# Patient Record
Sex: Female | Born: 2005 | Race: Black or African American | Hispanic: No | Marital: Single | Smoking: Never smoker
Health system: Southern US, Community
[De-identification: ages and names within clinical notes are randomized; demographics above are authoritative.]

## PROBLEM LIST (undated history)

## (undated) DIAGNOSIS — F419 Anxiety disorder, unspecified: Secondary | ICD-10-CM

## (undated) DIAGNOSIS — R519 Headache, unspecified: Secondary | ICD-10-CM

## (undated) HISTORY — DX: Anxiety disorder, unspecified: F41.9

## (undated) HISTORY — PX: HERNIA REPAIR: SHX51

## (undated) HISTORY — DX: Headache, unspecified: R51.9

## (undated) HISTORY — PX: UMBILICAL HERNIA REPAIR: SUR1181

---
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
## 2024-01-20 ENCOUNTER — Other Ambulatory Visit: Payer: Self-pay | Admitting: Family

## 2024-02-01 ENCOUNTER — Encounter: Payer: Self-pay | Admitting: Family

## 2024-02-14 ENCOUNTER — Encounter: Payer: Self-pay | Admitting: Family

## 2024-02-14 ENCOUNTER — Telehealth: Admitting: Family

## 2024-02-14 DIAGNOSIS — R102 Pelvic and perineal pain unspecified side: Secondary | ICD-10-CM

## 2024-02-14 NOTE — Progress Notes (Signed)
 THIS RECORD MAY CONTAIN CONFIDENTIAL INFORMATION THAT SHOULD NOT BE RELEASED WITHOUT REVIEW OF THE SERVICE PROVIDER.  Virtual Follow-Up Visit via Video Note  I connected with Jessica Mccarty  on 02/14/24 at  3:00 PM EST by a video enabled telemedicine application and verified that I am speaking with the correct person using two identifiers.   Patient/parent location: dorm, [employer] Provider location: remote, [place]    I discussed the limitations of evaluation and management by telemedicine and the availability of in person appointments.  I discussed that the purpose of this telehealth visit is to provide medical care while limiting exposure to the novel coronavirus.  The patient expressed understanding and agreed to proceed.   Jessica Mccarty is a 19 y.o. female referred by Belenda Macario HERO, NP here today for follow-up of pelvic pain.   History was provided by the patient.  Supervising Physician: Dr. Kreg Helena   Plan from Last Visit 12/28/23 1. Pelvic pain (Primary) 2. Friable cervix 3. Irregular intermenstrual bleeding - WET PREP BY MOLECULAR PROBE - Thyroid  Panel With TSH - CBC with Differential/Platelet - Comprehensive metabolic panel with GFR - hCG, quantitative, pregnancy - WET PREP BY MOLECULAR PROBE - Cervicovaginal ancillary only   -concern for friable cervix and +CMT today; will cover for PID -labs to asses hCG, thyroid , assess for anemia, liver and kidney enzymes; wet prep and gc/c from cervix today   - cefTRIAXone  (ROCEPHIN ) injection 500 mg - azithromycin  (ZITHROMAX ) tablet 1,000 mg - metroNIDAZOLE  (FLAGYL ) 500 MG tablet; Take 1 tablet (500 mg total) by mouth 2 (two) times daily for 14 days.  Dispense: 28 tablet; Refill: 0   4. Unprotected sexual intercourse - hCG, quantitative, pregnancy   5. High urine leukocyte count -high leuk count, foul smell urine; send for culture  - Urine Culture   6. Vitamin D  deficiency - VITAMIN D  25 Hydroxy (Vit-D Deficiency,  Fractures)   7. Routine screening for STI (sexually transmitted infection) - Cervicovaginal ancillary only   8. Screening for genitourinary condition - POCT urinalysis dipstick   9. Pregnancy examination or test, negative result - POCT urine pregnancy - hCG, quantitative, pregnancy  Chief Complaint:   History of Present Illness:  -same feeling of pelvic pain, but not as intense  -just got off her cycle, typically only bleeds for 5 days  -pain was before her cycle  -vaginal discharge changes, here and there with vaginal odor with fishy smell then it goes away  -no constipation issues  -some pain with intercourse; concern for reinfection - one of her biggest worries is reinfection; when she tried to take the Flagyl , she immediately threw up  -had really intense stomach pain yesterday; today much better  -appetite has been up and down; half eating or forcing herself to eat  -constipation has been OK lately; no issues lately  -no dysuria  -no rashes or lesions  -did not get a chance to take the Vitamin D  high dose supplementation   No Known Allergies Outpatient Medications Prior to Visit  Medication Sig Dispense Refill   ferrous sulfate  325 (65 FE) MG tablet TAKE ONE TABLET BY MOUTH DAILY WITH BREAKFAST. 90 tablet 1   fluticasone  (FLONASE ) 50 MCG/ACT nasal spray Place 2 sprays into both nostrils daily. Shake well before use. Gently blow nose before spraying. Do not blow nose immediately after use. You should not taste the medication or feel it going down your throat; if you do, adjust your technique. (Patient not taking: Reported on 12/28/2023) 16 g  0   Vitamin D , Ergocalciferol , (DRISDOL ) 1.25 MG (50000 UNIT) CAPS capsule Take 1 capsule (50,000 Units total) by mouth every 7 (seven) days. 8 capsule 0   No facility-administered medications prior to visit.     Patient Active Problem List   Diagnosis Date Noted   Annual physical exam 10/04/2018   BMI (body mass index), pediatric,  5% to less than 85% for age 08/16/2015    The following portions of the patient's history were reviewed and updated as appropriate: allergies, current medications, past family history, past medical history, past social history, past surgical history, and problem list.  Visual Observations/Objective:   General Appearance: Well nourished well developed, in no apparent distress.  Eyes: conjunctiva no swelling or erythema ENT/Mouth: No hoarseness, No cough for duration of visit.  Neck: Supple  Respiratory: Respiratory effort normal, normal rate, no retractions or distress.   Cardio: Appears well-perfused, noncyanotic Musculoskeletal: no obvious deformity Skin: visible skin without rashes, ecchymosis, erythema Neuro: Awake and oriented X 3,  Psych:  normal affect, Insight and Judgment appropriate.    Assessment/Plan: 1. Pelvic pain (Primary) -concern for reinfection  -recommended RTC this week for pelvic exam, as well as additional testing - will screen for CBCd, RPR, HIV and gc/c, wet prep by pelvic exam.     I discussed the assessment and treatment plan with the patient and/or parent/guardian.  They were provided an opportunity to ask questions and all were answered.  They agreed with the plan and demonstrated an understanding of the instructions. They were advised to call back or seek an in-person evaluation in the emergency room if the symptoms worsen or if the condition fails to improve as anticipated.   Follow-up:   in-person this week for pelvic exam and testing     Bari CHRISTELLA Molt, NP    CC: Klett, Macario CHRISTELLA, NP, Klett, Macario CHRISTELLA, NP

## 2024-02-15 ENCOUNTER — Encounter: Admitting: Family

## 2024-02-17 ENCOUNTER — Encounter: Payer: Self-pay | Admitting: *Deleted

## 2024-02-17 ENCOUNTER — Other Ambulatory Visit (HOSPITAL_COMMUNITY): Admission: RE | Admit: 2024-02-17 | Source: Ambulatory Visit

## 2024-02-17 ENCOUNTER — Encounter: Payer: Self-pay | Admitting: Family

## 2024-02-17 ENCOUNTER — Ambulatory Visit: Admitting: Family

## 2024-02-17 VITALS — BP 110/71 | HR 78 | Ht 65.35 in | Wt 146.4 lb

## 2024-02-17 DIAGNOSIS — N946 Dysmenorrhea, unspecified: Secondary | ICD-10-CM

## 2024-02-17 DIAGNOSIS — Z3202 Encounter for pregnancy test, result negative: Secondary | ICD-10-CM

## 2024-02-17 DIAGNOSIS — Z113 Encounter for screening for infections with a predominantly sexual mode of transmission: Secondary | ICD-10-CM

## 2024-02-17 LAB — POCT URINE PREGNANCY: Preg Test, Ur: NEGATIVE

## 2024-02-17 NOTE — Progress Notes (Addendum)
 " History was provided by the patient.  Jessica Mccarty is a 19 y.o. female who is here for dysmenorrhea.   PCP confirmed? Yes.    Belenda Macario HERO, NP  Plan from last visit:  1. Pelvic pain (Primary) -concern for reinfection  -recommended RTC this week for pelvic exam, as well as additional testing - will screen for CBCd, RPR, HIV and gc/c, wet prep by pelvic exam.     Pertinent Labs:  12/16 +BV 12/16 +chlamydia  Vitamin D  10  Hgb 11.3   Chart/Growth Chart Review:  Body mass index is 24.1 kg/m.   HPI:    -yesterday thought period was going to come on because she was having same symptoms; cramping for the past 2 days and thought it was her cycle to come on  -tries to push through the pain of periods, but will sometimes take Aleve  when pain is bad and she is standing all day at work  -describes this as normal cramping pain with cycle   -told mom, very random, about a dream that she was pregnant - was around family members, went into labor, had a boy and saw his face and he looked like her  -her sister's baby is now 1; dad recently had a new baby (he has about 14-15 kids total) was around him last week or week before - was holding baby and felt like she got baby fever   -took chlorophyll for BV/body odor/pH; does not feel like she took enough to make a difference    -not really in relationship but partner is older (by 8 years); they know each other through work; known each other for 2-3 months     Patient Active Problem List   Diagnosis Date Noted   Annual physical exam 10/04/2018   BMI (body mass index), pediatric, 5% to less than 85% for age 71/04/2015    Current Outpatient Medications on File Prior to Visit  Medication Sig Dispense Refill   ferrous sulfate  325 (65 FE) MG tablet TAKE ONE TABLET BY MOUTH DAILY WITH BREAKFAST. (Patient not taking: Reported on 02/17/2024) 90 tablet 1   fluticasone  (FLONASE ) 50 MCG/ACT nasal spray Place 2 sprays into both nostrils daily. Shake  well before use. Gently blow nose before spraying. Do not blow nose immediately after use. You should not taste the medication or feel it going down your throat; if you do, adjust your technique. (Patient not taking: Reported on 02/17/2024) 16 g 0   Vitamin D , Ergocalciferol , (DRISDOL ) 1.25 MG (50000 UNIT) CAPS capsule Take 1 capsule (50,000 Units total) by mouth every 7 (seven) days. (Patient not taking: Reported on 02/17/2024) 8 capsule 0   No current facility-administered medications on file prior to visit.    No Known Allergies  Physical Exam:    Vitals:   02/17/24 1028  BP: 110/71  Pulse: 78  Weight: 146 lb 6.4 oz (66.4 kg)  Height: 5' 5.35 (1.66 m)   Wt Readings from Last 3 Encounters:  02/17/24 146 lb 6.4 oz (66.4 kg) (77%, Z= 0.75)*  12/28/23 146 lb (66.2 kg) (77%, Z= 0.75)*  10/06/23 144 lb 9.6 oz (65.6 kg) (77%, Z= 0.73)*   * Growth percentiles are based on CDC (Girls, 2-20 Years) data.     Blood pressure %iles are not available for patients who are 18 years or older. No LMP recorded.  Physical Exam Vitals and nursing note reviewed.  Constitutional:      General: She is not in acute distress.  Appearance: She is well-developed.  HENT:     Head: Normocephalic.     Mouth/Throat:     Mouth: Mucous membranes are moist.  Eyes:     General: No scleral icterus.    Extraocular Movements: Extraocular movements intact.     Pupils: Pupils are equal, round, and reactive to light.  Neck:     Thyroid : No thyromegaly.  Cardiovascular:     Rate and Rhythm: Normal rate.  Pulmonary:     Breath sounds: Normal breath sounds.  Musculoskeletal:        General: Normal range of motion.     Cervical back: Normal range of motion.  Skin:    Capillary Refill: Capillary refill takes less than 2 seconds.  Neurological:     General: No focal deficit present.     Mental Status: She is alert.     Comments: No tremor  Psychiatric:        Mood and Affect: Mood normal.      Assessment/Plan: 1. Dysmenorrhea (Primary) -discussed option for Naproxen  use; will continue to monitor symptoms; return precautions provided; she elects to return for pelvic if symptoms persists; will screen for test for reinfection today on urine; reassuringly she describes her current pain as consistent with dysmenorrhea.  -discussed safe sex practices, relationship health, and concerns for repeat chlamydia infections in the last year. >45 minutes  spent with patient in conversation about the above, as well as chart review. Return for lab only visit as lab is not available today.  2. Pregnancy examination or test, negative result - POCT urine pregnancy   "

## 2024-02-18 LAB — URINE CYTOLOGY ANCILLARY ONLY
Chlamydia: NEGATIVE
Comment: NEGATIVE
Comment: NEGATIVE
Comment: NORMAL
Neisseria Gonorrhea: NEGATIVE
Trichomonas: NEGATIVE

## 2024-02-23 ENCOUNTER — Ambulatory Visit: Payer: Self-pay | Admitting: Family

## 2024-02-25 ENCOUNTER — Other Ambulatory Visit

## 2024-03-20 ENCOUNTER — Encounter: Payer: Self-pay | Admitting: Family
